# Patient Record
Sex: Female | Born: 1995 | Race: Black or African American | Hispanic: No | Marital: Single | Smoking: Never smoker
Health system: Southern US, Community
[De-identification: ages and names within clinical notes are randomized; demographics above are authoritative.]

---

## 2017-11-22 ENCOUNTER — Emergency Department (HOSPITAL_COMMUNITY)
Admission: EM | Admit: 2017-11-22 | Discharge: 2017-11-22 | Disposition: A | Discharge status: Home / Self Care | Payer: Managed Care, Other (non HMO) | Attending: Emergency Medicine | Admitting: Emergency Medicine

## 2017-11-22 ENCOUNTER — Emergency Department (HOSPITAL_COMMUNITY): Payer: Managed Care, Other (non HMO)

## 2017-11-22 ENCOUNTER — Encounter (HOSPITAL_COMMUNITY): Payer: Self-pay

## 2017-11-22 DIAGNOSIS — R109 Unspecified abdominal pain: Secondary | ICD-10-CM | POA: Diagnosis not present

## 2017-11-22 DIAGNOSIS — R112 Nausea with vomiting, unspecified: Secondary | ICD-10-CM | POA: Insufficient documentation

## 2017-11-22 DIAGNOSIS — R1010 Upper abdominal pain, unspecified: Secondary | ICD-10-CM

## 2017-11-22 LAB — CBC
HEMATOCRIT: 41.2 % (ref 36.0–46.0)
HEMOGLOBIN: 13.6 g/dL (ref 12.0–15.0)
MCH: 29.8 pg (ref 26.0–34.0)
MCHC: 33 g/dL (ref 30.0–36.0)
MCV: 90.4 fL (ref 78.0–100.0)
Platelets: 281 10*3/uL (ref 150–400)
RBC: 4.56 MIL/uL (ref 3.87–5.11)
RDW: 13.5 % (ref 11.5–15.5)
WBC: 6.2 10*3/uL (ref 4.0–10.5)

## 2017-11-22 LAB — URINALYSIS, ROUTINE W REFLEX MICROSCOPIC
BILIRUBIN URINE: NEGATIVE
Glucose, UA: NEGATIVE mg/dL
Hgb urine dipstick: NEGATIVE
Ketones, ur: NEGATIVE mg/dL
Leukocytes, UA: NEGATIVE
NITRITE: NEGATIVE
Protein, ur: NEGATIVE mg/dL
SPECIFIC GRAVITY, URINE: 1.02 (ref 1.005–1.030)
pH: 6 (ref 5.0–8.0)

## 2017-11-22 LAB — COMPREHENSIVE METABOLIC PANEL
ALBUMIN: 4 g/dL (ref 3.5–5.0)
ALT: 10 U/L — ABNORMAL LOW (ref 14–54)
ANION GAP: 10 (ref 5–15)
AST: 20 U/L (ref 15–41)
Alkaline Phosphatase: 44 U/L (ref 38–126)
BUN: 13 mg/dL (ref 6–20)
CALCIUM: 9.5 mg/dL (ref 8.9–10.3)
CO2: 21 mmol/L — AB (ref 22–32)
Chloride: 105 mmol/L (ref 101–111)
Creatinine, Ser: 0.8 mg/dL (ref 0.44–1.00)
GFR calc Af Amer: >60 mL/min (ref >60)
GFR calc non Af Amer: >60 mL/min (ref >60)
GLUCOSE: 90 mg/dL (ref 65–99)
POTASSIUM: 3.8 mmol/L (ref 3.5–5.1)
SODIUM: 136 mmol/L (ref 135–145)
TOTAL PROTEIN: 7.4 g/dL (ref 6.5–8.1)
Total Bilirubin: 0.5 mg/dL (ref 0.3–1.2)

## 2017-11-22 LAB — I-STAT BETA HCG BLOOD, ED (MC, WL, AP ONLY)

## 2017-11-22 LAB — LIPASE, BLOOD: Lipase: 34 U/L (ref 11–51)

## 2017-11-22 MED ORDER — SODIUM CHLORIDE 0.9 % IV BOLUS (SEPSIS)
500.0000 mL | Freq: Once | INTRAVENOUS | Status: AC
  Administered 2017-11-22: 500 mL via INTRAVENOUS

## 2017-11-22 MED ORDER — ONDANSETRON HCL 4 MG/2ML IJ SOLN
4.0000 mg | Freq: Once | INTRAMUSCULAR | Status: AC
  Administered 2017-11-22: 4 mg via INTRAVENOUS
  Filled 2017-11-22: qty 2

## 2017-11-22 MED ORDER — PANTOPRAZOLE SODIUM 40 MG PO TBEC: 40.0000 mg | DELAYED_RELEASE_TABLET | Freq: Every day | ORAL | 0 refills | Status: AC

## 2017-11-22 MED ORDER — FAMOTIDINE 20 MG PO TABS
20.0000 mg | ORAL_TABLET | Freq: Once | ORAL | Status: AC
  Administered 2017-11-22: 20 mg via ORAL
  Filled 2017-11-22: qty 1

## 2017-11-22 MED ORDER — ACETAMINOPHEN 325 MG PO TABS
650.0000 mg | ORAL_TABLET | Freq: Once | ORAL | Status: AC
Start: 2017-11-22 — End: 2017-11-22
  Administered 2017-11-22: 650 mg via ORAL
  Filled 2017-11-22: qty 2

## 2017-11-22 MED ORDER — KETOROLAC TROMETHAMINE 30 MG/ML IJ SOLN
30.0000 mg | Freq: Once | INTRAMUSCULAR | Status: AC
  Administered 2017-11-22: 30 mg via INTRAVENOUS
  Filled 2017-11-22: qty 1

## 2017-11-22 NOTE — ED Provider Notes (Signed)
[facility]   CSN: [id number] Arrival date & time: 11/22/17  0708     History   Chief Complaint Chief Complaint  Patient presents with  . Abdominal Pain  . Nausea    HPI Evelyn Morales is a 22 y.o. female with no significant past medical history, who presents to ED for evaluation of generalized, cramping abdominal pain for the past 3 days.  No aggravating or alleviating factors.  She reports associated nausea and one episode of NBNB vomiting that began this morning.  Reports she was in contact with her friend over the weekend with similar symptoms and they did share a plate of food.  She reports intermittent constipation but states that her last normal bowel movement was this morning.  She took 1 dose of Tylenol with no improvement in her symptoms.  She denies any urinary symptoms, vaginal discharge, abnormal vaginal bleeding, diarrhea, hematemesis, hematochezia or melena, fever or chest pain.  She denies alcohol, tobacco or other drug use.  HPI  History reviewed. No pertinent past medical history.  There are no active problems to display for this patient.   History reviewed. No pertinent surgical history.  OB History    No data available       Home Medications    Prior to Admission medications   Not on File    Family History No family history on file.  Social History Social History   Tobacco Use  . Smoking status: Never Smoker  . Smokeless tobacco: Never Used  Substance Use Topics  . Alcohol use: Not on file  . Drug use: Not on file     Allergies   Patient has no known allergies.   Review of Systems Review of Systems  Constitutional: Negative for appetite change, chills and fever.  HENT: Negative for ear pain, rhinorrhea, sneezing and sore throat.   Eyes: Negative for photophobia and visual disturbance.  Respiratory: Negative for cough, chest tightness, shortness of breath and wheezing.     Cardiovascular: Negative for chest pain and palpitations.  Gastrointestinal: Positive for abdominal pain, constipation, nausea and vomiting. Negative for blood in stool and diarrhea.  Genitourinary: Negative for dysuria, hematuria and urgency.  Musculoskeletal: Negative for myalgias.  Skin: Negative for rash.  Neurological: Negative for dizziness, weakness and light-headedness.     Physical Exam Updated Vital Signs BP 131/87   Pulse (!) 58   Temp 98.6 F (37 C) (Oral)   Resp 18   SpO2 100%   Physical Exam  Constitutional: She appears well-developed and well-nourished. No distress.  Nontoxic appearing and in no acute distress.  HENT:  Head: Normocephalic and atraumatic.  Nose: Nose normal.  Eyes: Conjunctivae and EOM are normal. Right eye exhibits no discharge. Left eye exhibits no discharge. No scleral icterus.  Neck: Normal range of motion. Neck supple.  Cardiovascular: Normal rate, regular rhythm, normal heart sounds and intact distal pulses. Exam reveals no gallop and no friction rub.  No murmur heard. Pulmonary/Chest: Effort normal and breath sounds normal. No respiratory distress.  Abdominal: Soft. Bowel sounds are normal. She exhibits no distension. There is generalized tenderness. There is no rebound, no guarding, no tenderness at McBurney's point and negative Murphy's sign.    Musculoskeletal: Normal range of motion. She exhibits no edema.  Neurological: She is alert. She exhibits normal muscle tone. Coordination normal.  Skin: Skin is warm and dry. No rash noted.  Psychiatric: She has a normal mood and affect.  Nursing  note and vitals reviewed.    ED Treatments / Results  Labs (all labs ordered are listed, but only abnormal results are displayed) Labs Reviewed  COMPREHENSIVE METABOLIC PANEL - Abnormal; Notable for the following components:      Result Value   CO2 21 (*)    ALT 10 (*)    All other components within normal limits  LIPASE, BLOOD  CBC   URINALYSIS, ROUTINE W REFLEX MICROSCOPIC  I-STAT BETA HCG BLOOD, ED ([facility], [facility], [facility] ONLY)    EKG  EKG Interpretation None       Radiology No results found.  Procedures Procedures (including critical care time)  Medications Ordered in ED Medications  sodium chloride 0.9 % bolus 500 mL (not administered)  ondansetron (ZOFRAN) injection 4 mg (not administered)  ketorolac (TORADOL) 30 MG/ML injection 30 mg (not administered)     Initial Impression / Assessment and Plan / ED Course  I have reviewed the triage vital signs and the nursing notes.  Pertinent labs & imaging results that were available during my care of the patient were reviewed by me and considered in my medical decision making (see chart for details).     Patient presents to ED for evaluation of 3 day history of generalized abdominal cramping, with associated nausea; 1 episode of NBNB emesis that happened this morning upon arrival to ED. Reports history of intermittent constipation but last normal BM was today. No urinary symptoms, vaginal discharge, abnormal vaginal bleeding, diarrhea. Sick contacts with similar symptoms this weekend. On physical exam she is overall well appearing. Generalized abdominal TTP noted on examination with no rebound, guarding, Murphy's sign or McBurney's point tenderness. She is afebrile with no history of fever. Labwork including CBC, CMP, lipase, UA, hcg all remarkable. Will obtain abdominal x-ray to evaluate for acute process. Will give Toradol, Zofran, fluids and reassess. If you handed off to Dr. Denton Lank pending reassessment.  Anticipate disposition home if lab work is unremarkable, x-rays unremarkable and patient reports improvement in symptoms.  Final Clinical Impressions(s) / ED Diagnoses   Final diagnoses:  None    ED Discharge Orders    None       Dietrich Pates, PA-C 11/22/17 1419    Cathren Laine, MD 11/22/17 [phone]

## 2017-11-22 NOTE — Discharge Instructions (Signed)
It was our pleasure to provide your ER care today - we hope that you feel better.  Your lab tests look good.  Take protonix (acid blocker medication). You may also try pepcid or maalox as need for symptom relief.  Follow up with primary care doctor in 1 week.  Return to ER if worse, new symptoms, fevers, worsening or severe pain, persistent vomiting, other concern.

## 2017-11-22 NOTE — ED Notes (Signed)
Gave pt a cup of ice water

## 2017-11-22 NOTE — ED Notes (Signed)
Iv attempted x 2 without success. 

## 2017-11-22 NOTE — ED Triage Notes (Signed)
Patient complains of intermittent abdominal cramping with nausea since Sunday. No vomiting, no diarrhea. Alert and oriented

## 2018-09-14 IMAGING — CR DG ABDOMEN 1V
1 series · 1 of 1 positions shown · non-contrast
Comparison: None.

CLINICAL DATA: Abdominal pain

EXAM:
ABDOMEN - 1 VIEW

[abdomen kub]
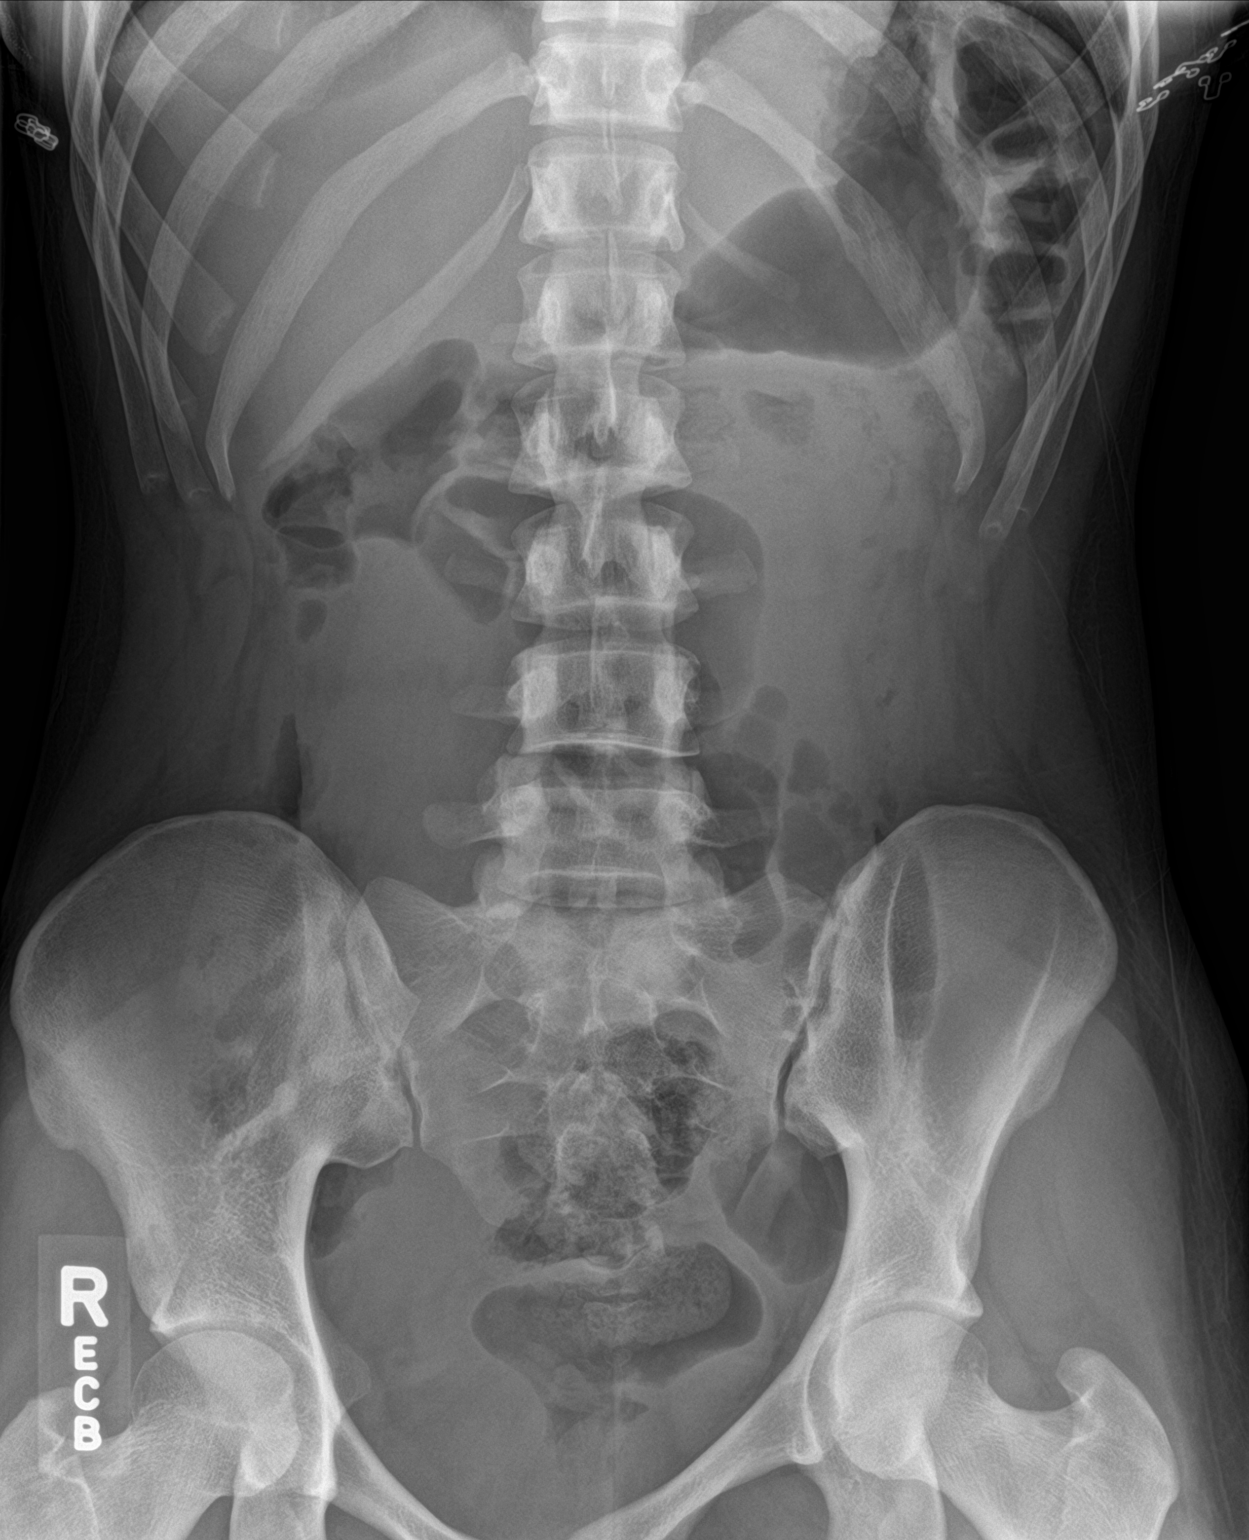

[1 of 1 positions shown; findings below may reference images not displayed]

FINDINGS: The bowel gas pattern is normal. No radio-opaque calculi or other
significant radiographic abnormality are seen.
IMPRESSION: No acute abnormality noted.
